# Patient Record
Sex: Male | Born: 1965 | Race: Black or African American | Hispanic: No | Smoking: Current some day smoker
Health system: Southern US, Community
[De-identification: ages and names within clinical notes are randomized; demographics above are authoritative.]

## PROBLEM LIST (undated history)

## (undated) DIAGNOSIS — J309 Allergic rhinitis, unspecified: Secondary | ICD-10-CM

## (undated) DIAGNOSIS — H109 Unspecified conjunctivitis: Secondary | ICD-10-CM

## (undated) HISTORY — DX: Allergic rhinitis, unspecified: J30.9

## (undated) HISTORY — DX: Unspecified conjunctivitis: H10.9

## (undated) HISTORY — PX: SHOULDER FUSION: SUR625

---

## 1999-06-14 ENCOUNTER — Emergency Department (HOSPITAL_COMMUNITY): Admission: EM | Admit: 1999-06-14 | Discharge: 1999-06-14 | Payer: Self-pay | Admitting: *Deleted

## 2004-01-18 ENCOUNTER — Emergency Department (HOSPITAL_COMMUNITY): Admission: AD | Admit: 2004-01-18 | Discharge: 2004-01-19 | Payer: Self-pay | Admitting: Emergency Medicine

## 2004-03-28 ENCOUNTER — Emergency Department (HOSPITAL_COMMUNITY): Admission: EM | Admit: 2004-03-28 | Discharge: 2004-03-29 | Payer: Self-pay | Admitting: *Deleted

## 2006-12-03 ENCOUNTER — Emergency Department (HOSPITAL_COMMUNITY): Admission: EM | Admit: 2006-12-03 | Discharge: 2006-12-03 | Payer: Self-pay | Admitting: *Deleted

## 2007-05-15 ENCOUNTER — Ambulatory Visit: Payer: Self-pay | Admitting: Internal Medicine

## 2007-06-26 ENCOUNTER — Ambulatory Visit: Payer: Self-pay | Admitting: Internal Medicine

## 2007-07-11 ENCOUNTER — Ambulatory Visit: Payer: Self-pay | Admitting: Internal Medicine

## 2007-07-14 ENCOUNTER — Ambulatory Visit: Payer: Self-pay | Admitting: Internal Medicine

## 2007-07-22 DIAGNOSIS — J309 Allergic rhinitis, unspecified: Secondary | ICD-10-CM | POA: Insufficient documentation

## 2007-07-24 ENCOUNTER — Ambulatory Visit: Payer: Self-pay | Admitting: Internal Medicine

## 2007-07-28 ENCOUNTER — Ambulatory Visit: Payer: Self-pay | Admitting: Internal Medicine

## 2007-08-01 ENCOUNTER — Ambulatory Visit: Payer: Self-pay | Admitting: Internal Medicine

## 2007-08-04 ENCOUNTER — Ambulatory Visit: Payer: Self-pay | Admitting: Internal Medicine

## 2007-08-08 ENCOUNTER — Ambulatory Visit: Payer: Self-pay | Admitting: Internal Medicine

## 2007-08-11 ENCOUNTER — Ambulatory Visit: Payer: Self-pay | Admitting: Internal Medicine

## 2007-08-15 ENCOUNTER — Ambulatory Visit: Payer: Self-pay | Admitting: Internal Medicine

## 2007-08-18 ENCOUNTER — Ambulatory Visit: Payer: Self-pay | Admitting: Internal Medicine

## 2007-08-29 ENCOUNTER — Ambulatory Visit: Payer: Self-pay | Admitting: Internal Medicine

## 2007-09-04 ENCOUNTER — Ambulatory Visit: Payer: Self-pay | Admitting: Internal Medicine

## 2007-09-08 ENCOUNTER — Ambulatory Visit: Payer: Self-pay | Admitting: Internal Medicine

## 2007-09-12 ENCOUNTER — Ambulatory Visit: Payer: Self-pay | Admitting: Internal Medicine

## 2007-09-13 ENCOUNTER — Ambulatory Visit: Payer: Self-pay | Admitting: Internal Medicine

## 2007-09-15 ENCOUNTER — Ambulatory Visit: Payer: Self-pay | Admitting: Internal Medicine

## 2007-09-26 ENCOUNTER — Ambulatory Visit: Payer: Self-pay | Admitting: Internal Medicine

## 2007-10-02 ENCOUNTER — Ambulatory Visit: Payer: Self-pay | Admitting: Internal Medicine

## 2007-10-05 HISTORY — PX: NOSE SURGERY: SHX723

## 2007-10-06 ENCOUNTER — Ambulatory Visit: Payer: Self-pay | Admitting: Internal Medicine

## 2007-10-10 ENCOUNTER — Ambulatory Visit: Payer: Self-pay | Admitting: Internal Medicine

## 2007-10-13 ENCOUNTER — Ambulatory Visit: Payer: Self-pay | Admitting: Internal Medicine

## 2007-10-23 ENCOUNTER — Encounter: Payer: Self-pay | Admitting: Internal Medicine

## 2007-10-31 ENCOUNTER — Ambulatory Visit: Payer: Self-pay | Admitting: Internal Medicine

## 2007-11-15 ENCOUNTER — Ambulatory Visit: Payer: Self-pay | Admitting: Internal Medicine

## 2007-11-17 ENCOUNTER — Ambulatory Visit: Payer: Self-pay | Admitting: Internal Medicine

## 2007-11-22 ENCOUNTER — Ambulatory Visit: Payer: Self-pay | Admitting: Internal Medicine

## 2007-11-24 ENCOUNTER — Ambulatory Visit: Payer: Self-pay | Admitting: Internal Medicine

## 2007-11-29 ENCOUNTER — Ambulatory Visit: Payer: Self-pay | Admitting: Internal Medicine

## 2007-12-01 ENCOUNTER — Ambulatory Visit: Payer: Self-pay | Admitting: Internal Medicine

## 2007-12-06 ENCOUNTER — Ambulatory Visit: Payer: Self-pay | Admitting: Internal Medicine

## 2007-12-08 ENCOUNTER — Ambulatory Visit: Payer: Self-pay | Admitting: Internal Medicine

## 2007-12-11 ENCOUNTER — Ambulatory Visit: Payer: Self-pay | Admitting: Internal Medicine

## 2007-12-12 ENCOUNTER — Ambulatory Visit: Payer: Self-pay | Admitting: Internal Medicine

## 2007-12-15 ENCOUNTER — Ambulatory Visit: Payer: Self-pay | Admitting: Internal Medicine

## 2007-12-20 ENCOUNTER — Ambulatory Visit: Payer: Self-pay | Admitting: Internal Medicine

## 2007-12-22 ENCOUNTER — Ambulatory Visit: Payer: Self-pay | Admitting: Internal Medicine

## 2007-12-26 ENCOUNTER — Ambulatory Visit: Payer: Self-pay | Admitting: Internal Medicine

## 2007-12-29 ENCOUNTER — Ambulatory Visit: Payer: Self-pay | Admitting: Internal Medicine

## 2008-01-03 ENCOUNTER — Ambulatory Visit: Payer: Self-pay | Admitting: Internal Medicine

## 2008-01-10 ENCOUNTER — Ambulatory Visit: Payer: Self-pay | Admitting: Internal Medicine

## 2008-01-18 ENCOUNTER — Ambulatory Visit: Payer: Self-pay | Admitting: Internal Medicine

## 2008-01-25 ENCOUNTER — Ambulatory Visit: Payer: Self-pay | Admitting: Internal Medicine

## 2008-02-01 ENCOUNTER — Ambulatory Visit: Payer: Self-pay | Admitting: Internal Medicine

## 2008-02-08 ENCOUNTER — Ambulatory Visit: Payer: Self-pay | Admitting: Internal Medicine

## 2008-02-22 ENCOUNTER — Ambulatory Visit: Payer: Self-pay | Admitting: Internal Medicine

## 2008-02-29 ENCOUNTER — Ambulatory Visit: Payer: Self-pay | Admitting: Internal Medicine

## 2008-03-15 ENCOUNTER — Ambulatory Visit: Payer: Self-pay | Admitting: Internal Medicine

## 2008-04-04 ENCOUNTER — Ambulatory Visit: Payer: Self-pay | Admitting: Internal Medicine

## 2008-04-11 ENCOUNTER — Ambulatory Visit: Payer: Self-pay | Admitting: Internal Medicine

## 2008-04-18 ENCOUNTER — Ambulatory Visit: Payer: Self-pay | Admitting: Internal Medicine

## 2008-04-26 ENCOUNTER — Ambulatory Visit: Payer: Self-pay | Admitting: Internal Medicine

## 2008-04-29 ENCOUNTER — Ambulatory Visit: Payer: Self-pay | Admitting: Internal Medicine

## 2008-05-03 ENCOUNTER — Ambulatory Visit: Payer: Self-pay | Admitting: Internal Medicine

## 2008-05-13 ENCOUNTER — Ambulatory Visit: Payer: Self-pay | Admitting: Internal Medicine

## 2008-05-24 ENCOUNTER — Ambulatory Visit: Payer: Self-pay | Admitting: Internal Medicine

## 2008-05-30 ENCOUNTER — Ambulatory Visit: Payer: Self-pay | Admitting: Internal Medicine

## 2008-06-14 ENCOUNTER — Ambulatory Visit: Payer: Self-pay | Admitting: Internal Medicine

## 2008-06-21 ENCOUNTER — Ambulatory Visit: Payer: Self-pay | Admitting: Internal Medicine

## 2008-07-03 ENCOUNTER — Ambulatory Visit: Payer: Self-pay | Admitting: Internal Medicine

## 2008-07-09 ENCOUNTER — Ambulatory Visit: Payer: Self-pay | Admitting: Internal Medicine

## 2008-07-26 ENCOUNTER — Ambulatory Visit: Payer: Self-pay | Admitting: Internal Medicine

## 2008-08-02 ENCOUNTER — Ambulatory Visit: Payer: Self-pay | Admitting: Internal Medicine

## 2008-08-09 ENCOUNTER — Ambulatory Visit: Payer: Self-pay | Admitting: Internal Medicine

## 2008-08-15 ENCOUNTER — Ambulatory Visit: Payer: Self-pay | Admitting: Internal Medicine

## 2008-08-27 ENCOUNTER — Ambulatory Visit: Payer: Self-pay | Admitting: Internal Medicine

## 2008-09-06 ENCOUNTER — Ambulatory Visit: Payer: Self-pay | Admitting: Internal Medicine

## 2008-09-17 ENCOUNTER — Ambulatory Visit: Payer: Self-pay | Admitting: Internal Medicine

## 2008-09-18 ENCOUNTER — Ambulatory Visit: Payer: Self-pay | Admitting: Internal Medicine

## 2008-10-08 ENCOUNTER — Ambulatory Visit: Payer: Self-pay | Admitting: Internal Medicine

## 2008-10-18 ENCOUNTER — Ambulatory Visit: Payer: Self-pay | Admitting: Internal Medicine

## 2008-10-25 ENCOUNTER — Ambulatory Visit: Payer: Self-pay | Admitting: Internal Medicine

## 2008-11-01 ENCOUNTER — Ambulatory Visit: Payer: Self-pay | Admitting: Internal Medicine

## 2008-11-11 ENCOUNTER — Ambulatory Visit: Payer: Self-pay | Admitting: Internal Medicine

## 2008-11-29 ENCOUNTER — Ambulatory Visit: Payer: Self-pay | Admitting: Internal Medicine

## 2008-12-11 ENCOUNTER — Ambulatory Visit: Payer: Self-pay | Admitting: Internal Medicine

## 2008-12-20 ENCOUNTER — Ambulatory Visit: Payer: Self-pay | Admitting: Internal Medicine

## 2008-12-27 ENCOUNTER — Ambulatory Visit: Payer: Self-pay | Admitting: Internal Medicine

## 2009-01-10 ENCOUNTER — Ambulatory Visit: Payer: Self-pay | Admitting: Internal Medicine

## 2009-01-29 ENCOUNTER — Ambulatory Visit: Payer: Self-pay | Admitting: Internal Medicine

## 2009-02-21 ENCOUNTER — Ambulatory Visit: Payer: Self-pay | Admitting: Internal Medicine

## 2009-02-26 ENCOUNTER — Ambulatory Visit: Payer: Self-pay | Admitting: Internal Medicine

## 2009-03-13 ENCOUNTER — Ambulatory Visit: Payer: Self-pay | Admitting: Internal Medicine

## 2009-03-20 ENCOUNTER — Ambulatory Visit: Payer: Self-pay | Admitting: Internal Medicine

## 2009-03-28 ENCOUNTER — Ambulatory Visit: Payer: Self-pay | Admitting: Internal Medicine

## 2009-04-01 ENCOUNTER — Ambulatory Visit: Payer: Self-pay | Admitting: Internal Medicine

## 2009-04-10 ENCOUNTER — Ambulatory Visit: Payer: Self-pay | Admitting: Internal Medicine

## 2009-04-18 ENCOUNTER — Ambulatory Visit: Payer: Self-pay | Admitting: Internal Medicine

## 2009-04-25 ENCOUNTER — Ambulatory Visit: Payer: Self-pay | Admitting: Internal Medicine

## 2009-05-01 ENCOUNTER — Ambulatory Visit: Payer: Self-pay | Admitting: Internal Medicine

## 2009-05-21 ENCOUNTER — Ambulatory Visit: Payer: Self-pay | Admitting: Internal Medicine

## 2009-05-30 ENCOUNTER — Ambulatory Visit: Payer: Self-pay | Admitting: Internal Medicine

## 2009-06-05 ENCOUNTER — Telehealth (INDEPENDENT_AMBULATORY_CARE_PROVIDER_SITE_OTHER): Payer: Self-pay | Admitting: *Deleted

## 2009-06-06 ENCOUNTER — Ambulatory Visit: Payer: Self-pay | Admitting: Internal Medicine

## 2009-06-12 ENCOUNTER — Ambulatory Visit: Payer: Self-pay | Admitting: Internal Medicine

## 2009-06-26 ENCOUNTER — Ambulatory Visit: Payer: Self-pay | Admitting: Internal Medicine

## 2009-07-03 ENCOUNTER — Ambulatory Visit: Payer: Self-pay | Admitting: Internal Medicine

## 2009-07-07 ENCOUNTER — Ambulatory Visit: Payer: Self-pay | Admitting: Internal Medicine

## 2009-07-21 ENCOUNTER — Ambulatory Visit: Payer: Self-pay | Admitting: Internal Medicine

## 2009-08-01 ENCOUNTER — Ambulatory Visit: Payer: Self-pay | Admitting: Internal Medicine

## 2009-08-22 ENCOUNTER — Ambulatory Visit: Payer: Self-pay | Admitting: Internal Medicine

## 2009-08-27 ENCOUNTER — Ambulatory Visit: Payer: Self-pay | Admitting: Internal Medicine

## 2009-09-19 ENCOUNTER — Ambulatory Visit: Payer: Self-pay | Admitting: Internal Medicine

## 2009-10-06 ENCOUNTER — Ambulatory Visit: Payer: Self-pay | Admitting: Internal Medicine

## 2009-10-08 ENCOUNTER — Ambulatory Visit: Payer: Self-pay | Admitting: Internal Medicine

## 2009-10-24 ENCOUNTER — Ambulatory Visit: Payer: Self-pay | Admitting: Internal Medicine

## 2009-11-07 ENCOUNTER — Ambulatory Visit: Payer: Self-pay | Admitting: Internal Medicine

## 2009-12-01 ENCOUNTER — Ambulatory Visit: Payer: Self-pay | Admitting: Internal Medicine

## 2009-12-08 ENCOUNTER — Ambulatory Visit: Payer: Self-pay | Admitting: Internal Medicine

## 2009-12-16 ENCOUNTER — Ambulatory Visit: Payer: Self-pay | Admitting: Internal Medicine

## 2010-01-09 ENCOUNTER — Ambulatory Visit: Payer: Self-pay | Admitting: Internal Medicine

## 2010-01-26 ENCOUNTER — Ambulatory Visit: Payer: Self-pay | Admitting: Internal Medicine

## 2010-02-10 ENCOUNTER — Ambulatory Visit: Payer: Self-pay | Admitting: Internal Medicine

## 2010-02-17 ENCOUNTER — Ambulatory Visit: Payer: Self-pay | Admitting: Internal Medicine

## 2010-02-24 ENCOUNTER — Ambulatory Visit: Payer: Self-pay | Admitting: Internal Medicine

## 2010-03-06 ENCOUNTER — Ambulatory Visit: Payer: Self-pay | Admitting: Internal Medicine

## 2010-03-13 ENCOUNTER — Ambulatory Visit: Payer: Self-pay | Admitting: Internal Medicine

## 2010-03-19 ENCOUNTER — Ambulatory Visit: Payer: Self-pay | Admitting: Internal Medicine

## 2010-03-31 ENCOUNTER — Ambulatory Visit: Payer: Self-pay | Admitting: Internal Medicine

## 2010-04-21 ENCOUNTER — Ambulatory Visit: Payer: Self-pay | Admitting: Internal Medicine

## 2010-05-11 ENCOUNTER — Ambulatory Visit: Payer: Self-pay | Admitting: Internal Medicine

## 2010-05-21 ENCOUNTER — Ambulatory Visit: Payer: Self-pay | Admitting: Internal Medicine

## 2010-06-04 ENCOUNTER — Ambulatory Visit: Payer: Self-pay | Admitting: Internal Medicine

## 2010-06-05 ENCOUNTER — Ambulatory Visit: Payer: Self-pay | Admitting: Internal Medicine

## 2010-06-11 ENCOUNTER — Ambulatory Visit: Payer: Self-pay | Admitting: Internal Medicine

## 2010-06-16 ENCOUNTER — Ambulatory Visit: Payer: Self-pay | Admitting: Internal Medicine

## 2010-06-23 ENCOUNTER — Ambulatory Visit: Payer: Self-pay | Admitting: Internal Medicine

## 2010-07-03 ENCOUNTER — Ambulatory Visit: Payer: Self-pay | Admitting: Internal Medicine

## 2010-07-10 ENCOUNTER — Ambulatory Visit: Payer: Self-pay | Admitting: Internal Medicine

## 2010-07-13 ENCOUNTER — Ambulatory Visit: Payer: Self-pay | Admitting: Internal Medicine

## 2010-07-20 ENCOUNTER — Ambulatory Visit: Payer: Self-pay | Admitting: Internal Medicine

## 2010-07-28 ENCOUNTER — Ambulatory Visit: Payer: Self-pay | Admitting: Internal Medicine

## 2010-08-04 ENCOUNTER — Ambulatory Visit: Payer: Self-pay | Admitting: Internal Medicine

## 2010-08-14 ENCOUNTER — Ambulatory Visit: Payer: Self-pay | Admitting: Internal Medicine

## 2010-08-19 ENCOUNTER — Ambulatory Visit: Payer: Self-pay | Admitting: Internal Medicine

## 2010-08-28 ENCOUNTER — Ambulatory Visit: Payer: Self-pay | Admitting: Internal Medicine

## 2010-09-10 ENCOUNTER — Ambulatory Visit: Payer: Self-pay | Admitting: Internal Medicine

## 2010-09-22 ENCOUNTER — Ambulatory Visit: Payer: Self-pay | Admitting: Internal Medicine

## 2010-10-02 ENCOUNTER — Ambulatory Visit: Payer: Self-pay | Admitting: Internal Medicine

## 2010-10-17 ENCOUNTER — Ambulatory Visit: Payer: Self-pay | Admitting: Internal Medicine

## 2010-10-22 ENCOUNTER — Ambulatory Visit: Payer: Self-pay | Admitting: Internal Medicine

## 2010-10-23 ENCOUNTER — Ambulatory Visit: Payer: Self-pay | Admitting: Internal Medicine

## 2010-11-02 ENCOUNTER — Ambulatory Visit: Payer: Self-pay | Admitting: Internal Medicine

## 2010-11-05 NOTE — Miscellaneous (Signed)
Summary: Injection Record / [facility]    Injection Record / [facility]    Imported By: Lennie Odor 06/05/2010 10:05:39  _____________________________________________________________________  External Attachment:    Type:   Image     Comment:   External Document

## 2010-11-05 NOTE — Miscellaneous (Signed)
Summary: Injection Record/[facility]  Injection Record/[facility]   Imported By: Sherian Rein 02/24/2010 11:58:41  _____________________________________________________________________  External Attachment:    Type:   Image     Comment:   External Document

## 2010-11-05 NOTE — Assessment & Plan Note (Signed)
Summary: allergy problem/  mbw   CC:  Allergy troubles-nasal congestion and watery eyes.Shane Pugh  History of Present Illness: From 07/24/07:  HISTORY:  He is getting vaccine here and recognizing that if he misses because of his job schedule he does not feel as well.  He has had no problem with reactions to his shots.  07/09/08- Worst in the Spring when eyes get bad. Saw NP then. Main c/o today is nasal congestion, perrennial and nonseasonal, with little discharge, no HA or earache. Chest ok.Nasal sprays never hslp for more than a few hours. Always congested nose.A little discharge.  No headache. . Works night shift.  Zyrtec only helps a few hours at a time.  December 16, 2009- Allergic rhinitis Was getting occasional steroid injection at [facility] but they cautioned him it might damage his hip.  Continues allergy vaccine at 1:10. Needing Allegra 180. Starts usually mid February. Usually problems mainly with Spring time. Has used steroid nasal sprays. Tried allegra.   Current Medications (verified): 1)  Allergy Vaccine 1:10 Gh .... Build From 1:50  Allergies (verified): No Known Drug Allergies  Past History:  Past Medical History: Last updated: 12/12/2007  ALLERGIC RHINITIS WITH CONJUNCTIVITIS (ICD-477.9)    Family History: Last updated: 08/01/2008 Mother-deceased age27 Father- living age 66 Sibling 1-living age43 Sibling 2-deceased age 63; vehicle accident  Social History: Last updated: 08/01/2008 Patient never smoked.  Positive history of passive tobacco smoke exposure.  ETOH-twice monthly Divorced with 2 children  Risk Factors: Smoking Status: never (08-01-08) Passive Smoke Exposure: yes (01-Aug-2008)  Review of Systems      See HPI  The patient denies anorexia, fever, weight loss, weight gain, vision loss, decreased hearing, hoarseness, chest pain, syncope, dyspnea on exertion, peripheral edema, prolonged cough, headaches, hemoptysis, and severe indigestion/heartburn.     Vital Signs:  Patient profile:   45 year old male Weight:      207.38 pounds O2 Sat:      99 % on Room air Pulse rate:   90 / minute BP sitting:   126 / 82  (left arm) Cuff size:   regular  Vitals Entered By: Reynaldo Minium CMA (December 16, 2009 10:14 AM)  O2 Flow:  Room air  Physical Exam  Additional Exam:  General: A/Ox3; pleasant and cooperative, NAD, SKIN: no rash, lesions NODES: no lymphadenopathy HEENT: [place]/AT, EOM- WNL, Conjuctivae- clear, PERRLA, TM-WNL, Nose- external dev to right, with septal deviation. Mucosa not markedly congested. Secretions clear. , Throat- clear and wnl NECK: Supple w/ fair ROM, JVD- none, normal carotid impulses w/o bruits Thyroid- normal to palpation CHEST: Clear to P&A HEART: RRR, no m/g/r heard ABDOMEN: Soft and nl;  XBJ:YNWG, nl pulses, no edema  NEURO: Grossly intact to observation      Impression & Recommendations:  Problem # 1:  ALLERGIC RHINITIS WITH CONJUNCTIVITIS (ICD-477.9)  Mild seasonal rhinitis exacerbatiojn. He continues allergy vaccine. We discussed nasal strips. Will sugggest adding a decongestant and trying a nasal steroid spray.  His updated medication list for this problem includes:    Nasonex 50 Mcg/act Susp (Mometasone furoate) .Shane Pugh... 1-2 sprays each nostril once every day  Orders: Est. Patient Level II (95621)  Medications Added to Medication List This Visit: 1)  Allegra-d 24 Hour 180-240 Mg Xr24h-tab (Fexofenadine-pseudoephedrine) 2)  Nasonex 50 Mcg/act Susp (Mometasone furoate) .Shane Pugh.. 1-2 sprays each nostril once every day  Patient Instructions: 1)  Please schedule a follow-up appointment in 1 year. 2)  continue allergy vaccine 3)  Try Allegra/ fexofenadine -  D 24 hr otc at pharmacy counter 4)  sample and script Nasonex nasal spray: 1-2 puffs each nostril every night at bedtime 5)  nasal strips- reminder to try Prescriptions: NASONEX 50 MCG/ACT SUSP (MOMETASONE FUROATE) 1-2 sprays each nostril once every day   #1 x prn   Entered and Authorized by:   Waymon Budge MD   Signed by:   Waymon Budge MD on 12/16/2009   Method used:   Print then Give to Patient   RxID:   [id number]

## 2010-11-05 NOTE — Miscellaneous (Signed)
Summary: Injection [employer] [facility] [employer]   Imported By: Sherian Rein 08/26/2010 14:14:49  _____________________________________________________________________  External Attachment:    Type:   Image     Comment:   External Document

## 2010-11-06 NOTE — Miscellaneous (Signed)
Summary: Injection Record/[facility]  Injection Record/[facility]   Imported By: Sherian Rein 03/26/2010 08:23:47  _____________________________________________________________________  External Attachment:    Type:   Image     Comment:   External Document

## 2010-11-06 NOTE — Miscellaneous (Signed)
Summary: Injection Orders / [facility]    Injection Orders / [facility]    Imported By: Lennie Odor 03/03/2010 15:14:55  _____________________________________________________________________  External Attachment:    Type:   Image     Comment:   External Document

## 2010-11-09 ENCOUNTER — Encounter: Payer: Self-pay | Admitting: Internal Medicine

## 2010-11-09 ENCOUNTER — Ambulatory Visit (INDEPENDENT_AMBULATORY_CARE_PROVIDER_SITE_OTHER): Payer: Self-pay | Admitting: Internal Medicine

## 2010-11-09 DIAGNOSIS — J309 Allergic rhinitis, unspecified: Secondary | ICD-10-CM

## 2010-11-19 NOTE — Assessment & Plan Note (Signed)
Summary: ROV//SH   CC:  Follow up visit-allergies; having sneezing during this time and cant tell a difference with allergy injecitons at this time.Marland Kitchen  History of Present Illness:  07/09/08- Worst in the Spring when eyes get bad. Saw NP then. Main c/o today is nasal congestion, perrennial and nonseasonal, with little discharge, no HA or earache. Chest ok.Nasal sprays never hslp for more than a few hours. Always congested nose.A little discharge.  No headache. . Works night shift.  Zyrtec only helps a few hours at a time.  December 16, 2009- Allergic rhinitis Was getting occasional steroid injection at [facility] but they cautioned him it might damage his hip.  Continues allergy vaccine at 1:10. Needing Allegra 180. Starts usually mid February. Usually problems mainly with Spring time. Has used steroid nasal sprays. Tried allegra.  2010/11/11- Allergic rhinitis Nurse-CC: Follow up visit-allergies; having sneezing during this time and cant tell a difference with allergy injecitons at this time. Every year around this time he starts sneezing and itching.  He continues Nasonex each night. Considering Zyrtec, but says it doesn't help much. Denies wheeze, cough, chest tightness, purulent mucus, headache, fever, nodes.       Preventive Screening-Counseling & Management  Alcohol-Tobacco     Smoking Status: never     Year Started: occasional     Passive Smoke Exposure: yes  Current Medications (verified): 1)  Allergy Vaccine 1:10 Gh .... Build From 1:50  Allergies (verified): No Known Drug Allergies  Past History:  Past Medical History: Last updated: 12/12/2007  ALLERGIC RHINITIS WITH CONJUNCTIVITIS (ICD-477.9)    Family History: Last updated: Nov 11, 2010 Mother-deceased age27 Father- living age 28 Sibling 1-living age43 Sibling 2-deceased age 65; vehicle accident Children- allergic rhinits  Social History: Last updated: 07/19/2008 Patient never smoked.  Positive history of  passive tobacco smoke exposure.  ETOH-twice monthly Divorced with 2 children  Risk Factors: Smoking Status: never (11-11-2010) Passive Smoke Exposure: yes (11/11/10)  Past Surgical History: none reported  Family History: Mother-deceased age27 Father- living age 60 Sibling 1-living age53 Sibling 2-deceased age 18; vehicle accident Children- allergic rhinits  Review of Systems      See HPI       The patient complains of nasal congestion/difficulty breathing through nose, sneezing, and itching.  The patient denies shortness of breath with activity, shortness of breath at rest, productive cough, non-productive cough, coughing up blood, chest pain, irregular heartbeats, acid heartburn, indigestion, loss of appetite, weight change, abdominal pain, difficulty swallowing, sore throat, tooth/dental problems, headaches, ear ache, depression, hand/feet swelling, rash, change in color of mucus, and fever.    Vital Signs:  Patient profile:   45 year old male Weight:      212.13 pounds O2 Sat:      98 % on Room air Pulse rate:   61 / minute BP sitting:   124 / 70  (left arm) Cuff size:   large  Vitals Entered By: Reynaldo Minium CMA 11/11/10 10:27 AM)  O2 Flow:  Room air CC: Follow up visit-allergies; having sneezing during this time and cant tell a difference with allergy injecitons at this time.   Physical Exam  Additional Exam:  General: A/Ox3; pleasant and cooperative, NAD, SKIN: no rash, lesions NODES: no lymphadenopathy HEENT: [place]/AT, EOM- WNL, Conjuctivae- clear, PERRLA, TM-WNL, Nose- external dev to right, with septal deviation. Mucosa red, edmeatous, no polyps. Secretions clear. , Throat- clear and wnl NECK: Supple w/ fair ROM, JVD- none, normal carotid impulses  w/o bruits Thyroid- normal to palpation CHEST: Clear to P&A HEART: RRR, no m/g/r heard ABDOMEN: Soft and nl;  OZH:YQMV, nl pulses, no edema  NEURO: Grossly intact to observation      Impression &  Recommendations:  Problem # 1:  ALLERGIC RHINITIS WITH CONJUNCTIVITIS (ICD-477.9)  He continues to have a very early seasonal pattern- due to indoor heat or early tree pollen rhintis. We are hoping to minimize steroids and I will let him stop shots. . For now we will add an antihistamine nasal spray.   The following medications were removed from the medication list:    Nasonex 50 Mcg/act Susp (Mometasone furoate) .Marland Kitchen... 1-2 sprays each nostril once every day  Other Orders: Est. Patient Level III (78469)  Patient Instructions: 1)  Please schedule a follow-up appointment in 4 months. 2)  OK to stop allergy shots now. Please let the allergy lab know if you decide to stop. 3)  Try sample/ script Astepro nasal antihistamine spray 4)     1-2 sprays each nostril up to twice daily if needed 5)  Try otc antihistamine fexofenadine 180/ Allegra 180 6)  If congested, you can add Phenylephrine (Sudafed-PE and others) as a decongestant.

## 2010-12-16 ENCOUNTER — Ambulatory Visit: Payer: Self-pay | Admitting: Internal Medicine

## 2011-01-23 ENCOUNTER — Ambulatory Visit (INDEPENDENT_AMBULATORY_CARE_PROVIDER_SITE_OTHER): Payer: Self-pay

## 2011-01-23 ENCOUNTER — Inpatient Hospital Stay (INDEPENDENT_AMBULATORY_CARE_PROVIDER_SITE_OTHER)
Admission: RE | Admit: 2011-01-23 | Discharge: 2011-01-23 | Disposition: A | Discharge status: Home / Self Care | Payer: Self-pay | Source: Ambulatory Visit | Attending: Family Medicine | Admitting: Family Medicine

## 2011-01-23 DIAGNOSIS — M25519 Pain in unspecified shoulder: Secondary | ICD-10-CM

## 2011-01-23 DIAGNOSIS — M719 Bursopathy, unspecified: Secondary | ICD-10-CM

## 2011-01-23 DIAGNOSIS — M67919 Unspecified disorder of synovium and tendon, unspecified shoulder: Secondary | ICD-10-CM

## 2011-02-16 NOTE — Assessment & Plan Note (Signed)
[facility]                             PULMONARY OFFICE NOTE   Shane Pugh, Shane Pugh                      MRN:          [id number]  DATE:07/24/2007                            DOB:          [birth year]    PROBLEM LIST:  1. Allergic rhinitis.  2. Allergic conjunctivitis.   HISTORY:  He is getting vaccine here and recognizing that if he misses  because of his job schedule he does not feel as well.  He has had no  problem with reactions to his shots.  Today he feels well.  We need to  clarify the status of his transfer of care from his allergist in [facility].  We have his vaccine, but no records have been brought over to  his chart, so I will check on that.  He is on no medication with no  medication allergy.   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  He looks comfortable.  Conjunctivae are not injected.  There is external deviation of the nose  with no history of trauma.  Nasal mucosa looks clear.  LUNGS:  Clear.  Pulse regular.   IMPRESSION:  1. Allergic rhinitis.  2. Allergic conjunctivitis.   PLAN:  He will continue vaccine buildup, and I will discuss status with  the allergy lab.  Try p.r.n. Claritin.  Schedule return 4 months,  earlier p.r.n.     Clinton D. Maple Hudson, MD, Tonny Bollman, FACP  Electronically Signed    CDY/MedQ  DD: 07/24/2007  DT: 07/25/2007  Job #: [id number]

## 2011-02-16 NOTE — Assessment & Plan Note (Signed)
[facility]                             PULMONARY OFFICE NOTE   DELOY, ARCHEY                      MRN:          [id number]  DATE:05/15/2007                            DOB:          11-27-1965    PROBLEM:  A 45 year old man, self-referred, allergic eyes and nose.   HISTORY:  This man began noticing itching and watering of eyes and nose  about three years ago at a time of heavy outdoor pollen exposure.  Each  year since then he has been having significant recurrence, usually in  March.  He was using a lot of antihistamine meds, then got allergy  tested and was begun on allergy vaccine in [place].  He does not  know the name of the physician, but is having to drive there twice a  week to get his shots and wishes to transfer care here to save that  travel difficulty.  He has had no trouble with his allergy vaccine.  No  asthma and no recognized prior allergy complaints.  He denies unusual  reactions to insects, foods, latex, contrast dye, or aspirin.   MEDICATIONS:  Limited to allergy vaccine.   ALLERGIES:  No medication allergy.   REVIEW OF SYSTEMS:  Limited to nasal congestion.  He denies other  symptoms currently but has watering and itching of eyes also in the  spring with no chest tightness, wheeze, or cough.   PAST MEDICAL HISTORY:  Good general health.  No ENT surgery.  No asthma.  No significant medical problems.   SOCIAL HISTORY:  Never smoked.  Separated with children, lives alone.  Works as a Emergency planning/management officer.   FAMILY HISTORY:  Children have some allergic rhinitis complaints.  He is  not aware of similar problems in his own parents or siblings.   OBJECTIVE:  Weight 219 pounds with his full equipment including vest and  utility belt.  Blood pressure 118/86, pulse 63, room air saturation 99%.  Comfortable-appearing, fit-appearing man.  SKIN:  No obvious rash.  ADENOPATHY:  None found at the neck or supraclavicular areas.  HEENT:  Conjunctivae are not injected.  Turbinates are edematous.  Palates facing 3-4/4.  HEART:  Through his vest I do not hear murmur or gallop.  Heart sounds  are regular.  LUNGS:  Lung sounds are not heard well but sound clear.  EXTREMITIES:  Without cyanosis, clubbing, or edema.   IMPRESSION:  Seasonal allergic rhinitis and allergic conjunctivitis.   PLAN:  He will get records and vaccine transferred here.  We have  discussed risk and safety issues of allergy vaccine and compared  expectations with antihistamines.  We have discussed  environmental precautions, although his work requires that he be  outdoors and exposed to the prevailing pollens.  I do not get much  history that there are problems with his home environment or workplace.  Schedule return with me in two months, earlier p.r.n.     Clinton D. Maple Hudson, MD, Tonny Bollman, FACP  Electronically Signed    CDY/MedQ  DD: 05/15/2007  DT: 05/17/2007  Job #: [id number]

## 2011-02-25 ENCOUNTER — Encounter: Payer: Self-pay | Admitting: Internal Medicine

## 2011-03-10 ENCOUNTER — Ambulatory Visit: Payer: Self-pay | Admitting: Internal Medicine

## 2015-06-14 ENCOUNTER — Inpatient Hospital Stay (HOSPITAL_COMMUNITY)
Admission: EM | Admit: 2015-06-14 | Discharge: 2015-06-18 | DRG: 184 | Disposition: A | Discharge status: Home / Self Care | Payer: Commercial Managed Care - HMO | Attending: General Surgery | Admitting: General Surgery

## 2015-06-14 ENCOUNTER — Emergency Department (HOSPITAL_COMMUNITY): Payer: Commercial Managed Care - HMO

## 2015-06-14 ENCOUNTER — Encounter (HOSPITAL_COMMUNITY): Payer: Self-pay

## 2015-06-14 DIAGNOSIS — S2243XA Multiple fractures of ribs, bilateral, initial encounter for closed fracture: Secondary | ICD-10-CM | POA: Diagnosis not present

## 2015-06-14 DIAGNOSIS — S2239XA Fracture of one rib, unspecified side, initial encounter for closed fracture: Secondary | ICD-10-CM

## 2015-06-14 DIAGNOSIS — S2232XA Fracture of one rib, left side, initial encounter for closed fracture: Secondary | ICD-10-CM

## 2015-06-14 DIAGNOSIS — S2249XA Multiple fractures of ribs, unspecified side, initial encounter for closed fracture: Secondary | ICD-10-CM

## 2015-06-14 DIAGNOSIS — R0781 Pleurodynia: Secondary | ICD-10-CM | POA: Diagnosis not present

## 2015-06-14 DIAGNOSIS — N189 Chronic kidney disease, unspecified: Secondary | ICD-10-CM | POA: Diagnosis present

## 2015-06-14 DIAGNOSIS — Y92410 Unspecified street and highway as the place of occurrence of the external cause: Secondary | ICD-10-CM

## 2015-06-14 DIAGNOSIS — S27329A Contusion of lung, unspecified, initial encounter: Secondary | ICD-10-CM | POA: Diagnosis present

## 2015-06-14 LAB — COMPREHENSIVE METABOLIC PANEL
ALK PHOS: 57 U/L (ref 38–126)
ALT: 45 U/L (ref 17–63)
ANION GAP: 10 (ref 5–15)
AST: 40 U/L (ref 15–41)
Albumin: 3.8 g/dL (ref 3.5–5.0)
BILIRUBIN TOTAL: 0.8 mg/dL (ref 0.3–1.2)
BUN: 15 mg/dL (ref 6–20)
CALCIUM: 8.7 mg/dL — AB (ref 8.9–10.3)
CO2: 25 mmol/L (ref 22–32)
Chloride: 105 mmol/L (ref 101–111)
Creatinine, Ser: 1.58 mg/dL — ABNORMAL HIGH (ref 0.61–1.24)
GFR calc non Af Amer: 50 mL/min — ABNORMAL LOW (ref >60)
GFR, EST AFRICAN AMERICAN: 58 mL/min — AB (ref >60)
Glucose, Bld: 137 mg/dL — ABNORMAL HIGH (ref 65–99)
POTASSIUM: 3 mmol/L — AB (ref 3.5–5.1)
SODIUM: 140 mmol/L (ref 135–145)
TOTAL PROTEIN: 7.3 g/dL (ref 6.5–8.1)

## 2015-06-14 LAB — CBC
HEMATOCRIT: 44.6 % (ref 39.0–52.0)
Hemoglobin: 15.2 g/dL (ref 13.0–17.0)
MCH: 27.8 pg (ref 26.0–34.0)
MCHC: 34.1 g/dL (ref 30.0–36.0)
MCV: 81.7 fL (ref 78.0–100.0)
PLATELETS: 237 10*3/uL (ref 150–400)
RBC: 5.46 MIL/uL (ref 4.22–5.81)
RDW: 14.2 % (ref 11.5–15.5)
WBC: 14.8 10*3/uL — ABNORMAL HIGH (ref 4.0–10.5)

## 2015-06-14 LAB — PROTIME-INR
INR: 1.14 (ref 0.00–1.49)
Prothrombin Time: 14.8 seconds (ref 11.6–15.2)

## 2015-06-14 LAB — SAMPLE TO BLOOD BANK

## 2015-06-14 LAB — I-STAT TROPONIN, ED: Troponin i, poc: 0 ng/mL (ref 0.00–0.08)

## 2015-06-14 MED ORDER — HYDROMORPHONE HCL 1 MG/ML IJ SOLN
1.0000 mg | Freq: Once | INTRAMUSCULAR | Status: AC
Start: 2015-06-14 — End: 2015-06-14
  Administered 2015-06-14: 1 mg via INTRAVENOUS
  Filled 2015-06-14: qty 1

## 2015-06-14 MED ORDER — ONDANSETRON HCL 4 MG/2ML IJ SOLN
4.0000 mg | Freq: Once | INTRAMUSCULAR | Status: AC
  Administered 2015-06-14: 4 mg via INTRAVENOUS
  Filled 2015-06-14: qty 2

## 2015-06-14 MED ORDER — HYDROMORPHONE HCL 1 MG/ML IJ SOLN
1.0000 mg | Freq: Once | INTRAMUSCULAR | Status: AC
  Administered 2015-06-14: 1 mg via INTRAVENOUS
  Filled 2015-06-14: qty 1

## 2015-06-14 MED ORDER — TETANUS-DIPHTH-ACELL PERTUSSIS 5-2.5-18.5 LF-MCG/0.5 IM SUSP
0.5000 mL | Freq: Once | INTRAMUSCULAR | Status: AC
  Administered 2015-06-14: 0.5 mL via INTRAMUSCULAR
  Filled 2015-06-14: qty 0.5

## 2015-06-14 MED ORDER — SODIUM CHLORIDE 0.9 % IV BOLUS (SEPSIS)
1000.0000 mL | Freq: Once | INTRAVENOUS | Status: AC
  Administered 2015-06-14: 1000 mL via INTRAVENOUS

## 2015-06-14 MED ORDER — IOHEXOL 300 MG/ML  SOLN
100.0000 mL | Freq: Once | INTRAMUSCULAR | Status: AC | PRN
  Administered 2015-06-14: 100 mL via INTRAVENOUS
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
## 2015-06-15 ENCOUNTER — Inpatient Hospital Stay (HOSPITAL_COMMUNITY): Payer: Commercial Managed Care - HMO

## 2015-06-15 DIAGNOSIS — S2243XA Multiple fractures of ribs, bilateral, initial encounter for closed fracture: Secondary | ICD-10-CM | POA: Diagnosis present

## 2015-06-15 DIAGNOSIS — N189 Chronic kidney disease, unspecified: Secondary | ICD-10-CM | POA: Diagnosis present

## 2015-06-15 DIAGNOSIS — Y92410 Unspecified street and highway as the place of occurrence of the external cause: Secondary | ICD-10-CM | POA: Diagnosis not present

## 2015-06-15 DIAGNOSIS — S27329A Contusion of lung, unspecified, initial encounter: Secondary | ICD-10-CM | POA: Diagnosis present

## 2015-06-15 DIAGNOSIS — R0781 Pleurodynia: Secondary | ICD-10-CM | POA: Diagnosis present

## 2015-06-15 LAB — BASIC METABOLIC PANEL
Anion gap: 7 (ref 5–15)
BUN: 11 mg/dL (ref 6–20)
CALCIUM: 8.7 mg/dL — AB (ref 8.9–10.3)
CO2: 25 mmol/L (ref 22–32)
CREATININE: 1.26 mg/dL — AB (ref 0.61–1.24)
Chloride: 107 mmol/L (ref 101–111)
GFR calc non Af Amer: >60 mL/min (ref >60)
Glucose, Bld: 132 mg/dL — ABNORMAL HIGH (ref 65–99)
Potassium: 4.5 mmol/L (ref 3.5–5.1)
SODIUM: 139 mmol/L (ref 135–145)

## 2015-06-15 LAB — CBC
HCT: 42.5 % (ref 39.0–52.0)
HEMOGLOBIN: 14.6 g/dL (ref 13.0–17.0)
MCH: 28 pg (ref 26.0–34.0)
MCHC: 34.4 g/dL (ref 30.0–36.0)
MCV: 81.6 fL (ref 78.0–100.0)
PLATELETS: 226 10*3/uL (ref 150–400)
RBC: 5.21 MIL/uL (ref 4.22–5.81)
RDW: 14.6 % (ref 11.5–15.5)
WBC: 13.8 10*3/uL — ABNORMAL HIGH (ref 4.0–10.5)

## 2015-06-15 MED ORDER — DIPHENHYDRAMINE HCL 50 MG/ML IJ SOLN: 12.5000 mg | Freq: Four times a day (QID) | INTRAMUSCULAR | Status: DC | PRN

## 2015-06-15 MED ORDER — DIPHENHYDRAMINE HCL 12.5 MG/5ML PO ELIX: 12.5000 mg | ORAL_SOLUTION | Freq: Four times a day (QID) | ORAL | Status: DC | PRN

## 2015-06-15 MED ORDER — MORPHINE SULFATE 1 MG/ML IV SOLN
INTRAVENOUS | Status: DC
  Administered 2015-06-15: 19:00:00 via INTRAVENOUS
  Administered 2015-06-15: 18 mg via INTRAVENOUS
  Administered 2015-06-15: 12:00:00 via INTRAVENOUS
  Administered 2015-06-16: 4.5 mL via INTRAVENOUS
  Administered 2015-06-16: 0 mL via INTRAVENOUS
  Filled 2015-06-15 (×2): qty 25

## 2015-06-15 MED ORDER — SODIUM CHLORIDE 0.9 % IV SOLN
INTRAVENOUS | Status: DC
  Administered 2015-06-15: 02:00:00 via INTRAVENOUS

## 2015-06-15 MED ORDER — ONDANSETRON HCL 4 MG/2ML IJ SOLN
4.0000 mg | Freq: Four times a day (QID) | INTRAMUSCULAR | Status: DC | PRN
  Administered 2015-06-15 – 2015-06-17 (×3): 4 mg via INTRAVENOUS
  Filled 2015-06-15 (×3): qty 2

## 2015-06-15 MED ORDER — ONDANSETRON HCL 4 MG PO TABS
4.0000 mg | ORAL_TABLET | Freq: Four times a day (QID) | ORAL | Status: DC | PRN
  Administered 2015-06-18: 4 mg via ORAL
  Filled 2015-06-15: qty 1

## 2015-06-15 MED ORDER — METOCLOPRAMIDE HCL 5 MG/ML IJ SOLN
10.0000 mg | Freq: Once | INTRAMUSCULAR | Status: AC
  Administered 2015-06-15: 10 mg via INTRAVENOUS
  Filled 2015-06-15: qty 2

## 2015-06-15 MED ORDER — NALOXONE HCL 0.4 MG/ML IJ SOLN: 0.4000 mg | INTRAMUSCULAR | Status: DC | PRN

## 2015-06-15 MED ORDER — SODIUM CHLORIDE 0.9 % IJ SOLN: 9.0000 mL | INTRAMUSCULAR | Status: DC | PRN

## 2015-06-15 MED ORDER — ACETAMINOPHEN 325 MG PO TABS: 650.0000 mg | ORAL_TABLET | ORAL | Status: DC | PRN

## 2015-06-15 MED ORDER — HYDROMORPHONE HCL 1 MG/ML IJ SOLN
1.0000 mg | INTRAMUSCULAR | Status: DC | PRN
  Administered 2015-06-15: 1 mg via INTRAVENOUS
  Filled 2015-06-15: qty 1

## 2015-06-15 MED ORDER — DOCUSATE SODIUM 100 MG PO CAPS
100.0000 mg | ORAL_CAPSULE | Freq: Two times a day (BID) | ORAL | Status: DC
  Administered 2015-06-15 – 2015-06-18 (×7): 100 mg via ORAL
  Filled 2015-06-15 (×6): qty 1

## 2015-06-15 MED ORDER — ONDANSETRON HCL 4 MG/2ML IJ SOLN: 4.0000 mg | Freq: Four times a day (QID) | INTRAMUSCULAR | Status: DC | PRN

## 2015-06-15 MED ORDER — ENOXAPARIN SODIUM 40 MG/0.4ML ~~LOC~~ SOLN
40.0000 mg | SUBCUTANEOUS | Status: DC
  Administered 2015-06-15 – 2015-06-17 (×3): 40 mg via SUBCUTANEOUS
  Filled 2015-06-15 (×3): qty 0.4

## 2015-06-15 MED ORDER — OXYCODONE HCL 5 MG PO TABS: 10.0000 mg | ORAL_TABLET | ORAL | Status: DC | PRN

## 2015-06-15 MED ORDER — OXYCODONE HCL 5 MG PO TABS: 5.0000 mg | ORAL_TABLET | ORAL | Status: DC | PRN

## 2015-06-15 MED ORDER — HYDROMORPHONE HCL 1 MG/ML IJ SOLN
1.0000 mg | Freq: Once | INTRAMUSCULAR | Status: AC
  Administered 2015-06-15: 1 mg via INTRAVENOUS
  Filled 2015-06-15: qty 1
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
## 2015-06-15 NOTE — ED Notes (Signed)
Report attempted 

## 2015-06-16 ENCOUNTER — Inpatient Hospital Stay (HOSPITAL_COMMUNITY): Payer: Commercial Managed Care - HMO

## 2015-06-16 DIAGNOSIS — S2243XA Multiple fractures of ribs, bilateral, initial encounter for closed fracture: Secondary | ICD-10-CM | POA: Diagnosis present

## 2015-06-16 DIAGNOSIS — N189 Chronic kidney disease, unspecified: Secondary | ICD-10-CM | POA: Diagnosis present

## 2015-06-16 LAB — BASIC METABOLIC PANEL
ANION GAP: 6 (ref 5–15)
BUN: 12 mg/dL (ref 6–20)
CALCIUM: 8.2 mg/dL — AB (ref 8.9–10.3)
CO2: 24 mmol/L (ref 22–32)
CREATININE: 1.3 mg/dL — AB (ref 0.61–1.24)
Chloride: 106 mmol/L (ref 101–111)
GFR calc Af Amer: >60 mL/min (ref >60)
GFR calc non Af Amer: >60 mL/min (ref >60)
GLUCOSE: 124 mg/dL — AB (ref 65–99)
Potassium: 4.1 mmol/L (ref 3.5–5.1)
Sodium: 136 mmol/L (ref 135–145)

## 2015-06-16 MED ORDER — OXYCODONE HCL 5 MG PO TABS
10.0000 mg | ORAL_TABLET | ORAL | Status: DC | PRN
  Administered 2015-06-16 (×3): 10 mg via ORAL
  Administered 2015-06-17 – 2015-06-18 (×7): 15 mg via ORAL
  Filled 2015-06-16 (×2): qty 3
  Filled 2015-06-16 (×2): qty 2
  Filled 2015-06-16 (×4): qty 3
  Filled 2015-06-16: qty 2
  Filled 2015-06-16: qty 3

## 2015-06-16 MED ORDER — ENSURE ENLIVE PO LIQD
237.0000 mL | Freq: Every day | ORAL | Status: DC
  Administered 2015-06-16 – 2015-06-17 (×2): 237 mL via ORAL

## 2015-06-16 MED ORDER — POLYETHYLENE GLYCOL 3350 17 G PO PACK
17.0000 g | PACK | Freq: Every day | ORAL | Status: DC
  Administered 2015-06-16 – 2015-06-18 (×3): 17 g via ORAL
  Filled 2015-06-16 (×3): qty 1

## 2015-06-16 MED ORDER — HYDROMORPHONE HCL 1 MG/ML IJ SOLN
1.0000 mg | INTRAMUSCULAR | Status: DC | PRN
  Administered 2015-06-16 – 2015-06-17 (×2): 1 mg via INTRAVENOUS
  Filled 2015-06-16 (×2): qty 1
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
## 2015-06-17 MED ORDER — NAPHAZOLINE-PHENIRAMINE 0.025-0.3 % OP SOLN
1.0000 [drp] | Freq: Four times a day (QID) | OPHTHALMIC | Status: DC | PRN
  Administered 2015-06-17 (×2): 1 [drp] via OPHTHALMIC
  Filled 2015-06-17 (×3): qty 5

## 2015-06-17 MED ORDER — TRAMADOL HCL 50 MG PO TABS
100.0000 mg | ORAL_TABLET | Freq: Four times a day (QID) | ORAL | Status: DC
  Administered 2015-06-17 – 2015-06-18 (×4): 100 mg via ORAL
  Filled 2015-06-17 (×5): qty 2
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
## 2015-06-18 MED ORDER — OXYCODONE HCL 10 MG PO TABS: 10.0000 mg | ORAL_TABLET | Freq: Four times a day (QID) | ORAL | Status: DC | PRN

## 2015-06-18 MED ORDER — TRAMADOL HCL 50 MG PO TABS: 100.0000 mg | ORAL_TABLET | Freq: Four times a day (QID) | ORAL | Status: DC

## 2015-06-18 MED ORDER — POLYETHYLENE GLYCOL 3350 17 G PO PACK: 17.0000 g | PACK | Freq: Every day | ORAL | Status: DC

## 2015-06-18 MED ORDER — ACETAMINOPHEN 325 MG PO TABS: 650.0000 mg | ORAL_TABLET | ORAL | Status: DC | PRN

## 2015-06-18 MED ORDER — DOCUSATE SODIUM 100 MG PO CAPS: 100.0000 mg | ORAL_CAPSULE | Freq: Two times a day (BID) | ORAL | Status: DC
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
## 2015-06-18 NOTE — Progress Notes (Signed)
Pt ready for d/c per MD. Cleared by PT, has cane for ambulation. Discharge teaching and prescriptions given to pt and wife at bedside, all questions answered. Belongings gathered and will be removed by wife.   Shane Pugh  06/18/2015 2:20 PM
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
## 2015-06-18 NOTE — Discharge Instructions (Signed)
Rib fracture -heal on their own in 6-8 weeks. -your pain will get better week by week. -continue to walk, use incentive spirometry and activity as tolerated.  If it hurts, don't do it and ease back into activity.  Pain control -avoid Ibuprofen, motrin, advil because of your kidneys. -take the tramadol every 6 hours and than the oxycodone as needed.  If you have severe pain, take 2 tablets, mild pain 1 tablet. -as your pain improves, reduce and wean off the oxycodone first and than the tramadol.
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
## 2015-07-15 ENCOUNTER — Telehealth (HOSPITAL_COMMUNITY): Payer: Self-pay | Admitting: Orthopedic Surgery

## 2015-07-16 NOTE — Telephone Encounter (Signed)
Identified Dr. Lindie SpruceWyatt as attending MD of record.

## 2015-07-22 ENCOUNTER — Ambulatory Visit: Payer: Commercial Managed Care - HMO | Attending: General Surgery | Admitting: Physical Therapy

## 2015-07-22 DIAGNOSIS — R293 Abnormal posture: Secondary | ICD-10-CM | POA: Diagnosis present

## 2015-07-22 DIAGNOSIS — R6889 Other general symptoms and signs: Secondary | ICD-10-CM | POA: Diagnosis present

## 2015-07-22 DIAGNOSIS — R0781 Pleurodynia: Secondary | ICD-10-CM | POA: Diagnosis not present

## 2015-07-22 DIAGNOSIS — R079 Chest pain, unspecified: Secondary | ICD-10-CM | POA: Diagnosis present

## 2015-07-22 DIAGNOSIS — R208 Other disturbances of skin sensation: Secondary | ICD-10-CM | POA: Diagnosis present

## 2015-07-22 DIAGNOSIS — R0789 Other chest pain: Secondary | ICD-10-CM

## 2015-07-22 DIAGNOSIS — R200 Anesthesia of skin: Secondary | ICD-10-CM

## 2015-07-22 NOTE — Patient Instructions (Signed)
Posture Tips DO: - stand tall and erect - keep chin tucked in - keep head and shoulders in alignment - check posture regularly in mirror or large window - pull head back against headrest in car seat;  Change your position often.  Sit with lumbar support. DON'T: - slouch or slump while watching TV or reading - sit, stand or lie in one position  for too long;  Sitting is especially hard on the spine so if you sit at a desk/use the computer, then stand up often!   Copyright  VHI. All rights reserved.  Posture - Standing   Good posture is important. Avoid slouching and forward head thrust. Maintain curve in low back and align ears over shoul- ders, hips over ankles.  Pull your belly button in toward your back bone. Stand with even weight through feet and rib cage up and chin tuck down.  Not military.  Pretend to have golden string in sternum up to sky.   Copyright  VHI. All rights reserved.  Posture - Sitting   Sit upright, head facing forward. Try using a roll to support lower back. Keep shoulders relaxed, and avoid rounded back. Keep hips level with knees. Avoid crossing legs for long periods. Sit on sit bones not tail bone and use a lumbar support.  Avoid perching on edge of the seat.    Copyright  VHI. All rights reserved.  Pt given Right sidelying Quadratus lumborum stretch for left rib stretch.  Garen LahLawrie Beardsley, PT 07/22/2015 10:13 AM Phone: [phone] Fax: [phone]
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
## 2015-07-24 ENCOUNTER — Ambulatory Visit: Payer: Commercial Managed Care - HMO | Admitting: Physical Therapy

## 2015-07-24 DIAGNOSIS — R200 Anesthesia of skin: Secondary | ICD-10-CM

## 2015-07-24 DIAGNOSIS — R293 Abnormal posture: Secondary | ICD-10-CM

## 2015-07-24 DIAGNOSIS — R6889 Other general symptoms and signs: Secondary | ICD-10-CM

## 2015-07-24 DIAGNOSIS — R0789 Other chest pain: Secondary | ICD-10-CM

## 2015-07-24 DIAGNOSIS — R0781 Pleurodynia: Secondary | ICD-10-CM | POA: Diagnosis not present
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
## 2015-07-31 ENCOUNTER — Ambulatory Visit: Payer: Commercial Managed Care - HMO | Admitting: Physical Therapy

## 2015-07-31 DIAGNOSIS — R293 Abnormal posture: Secondary | ICD-10-CM

## 2015-07-31 DIAGNOSIS — R0789 Other chest pain: Secondary | ICD-10-CM

## 2015-07-31 DIAGNOSIS — R200 Anesthesia of skin: Secondary | ICD-10-CM

## 2015-07-31 DIAGNOSIS — R6889 Other general symptoms and signs: Secondary | ICD-10-CM

## 2015-07-31 DIAGNOSIS — R0781 Pleurodynia: Secondary | ICD-10-CM | POA: Diagnosis not present

## 2015-07-31 NOTE — Patient Instructions (Addendum)
Neurovascular: Ulnar Nerve Glide With Elbow Bias - Supine    Lie with neck supported, right arm out to side, elbow bent to pain-free position, fingers pointing toward ear, palm up. Slowly bend elbow further, as far as possible without pain, bringing hand toward head. Repeat ____ times per set. Do ____ sets per session. Do ____ sessions per week.  Copyright  VHI. All rights reserved.  Trigger Point Dry Needling  . What is Trigger Point Dry Needling (DN)? o DN is a physical therapy technique used to treat muscle pain and dysfunction. Specifically, DN helps deactivate muscle trigger points (muscle knots).  o A thin filiform needle is used to penetrate the skin and stimulate the underlying trigger point. The goal is for a local twitch response (LTR) to occur and for the trigger point to relax. No medication of any kind is injected during the procedure.   . What Does Trigger Point Dry Needling Feel Like?  o The procedure feels different for each individual patient. Some patients report that they do not actually feel the needle enter the skin and overall the process is not painful. Very mild bleeding may occur. However, many patients feel a deep cramping in the muscle in which the needle was inserted. This is the local twitch response.   Marland Kitchen. How Will I feel after the treatment? o Soreness is normal, and the onset of soreness may not occur for a few hours. Typically this soreness does not last longer than two days.  o Bruising is uncommon, however; ice can be used to decrease any possible bruising.  o In rare cases feeling tired or nauseous after the treatment is normal. In addition, your symptoms may get worse before they get better, this period will typically not last longer than 24 hours.   . What Can I do After My Treatment? o Increase your hydration by drinking more water for the next 24 hours. o You may place ice or heat on the areas treated that have become sore, however, do not use heat on  inflamed or bruised areas. Heat often brings more relief post needling. o You can continue your regular activities, but vigorous activity is not recommended initially after the treatment for 24 hours. o DN is best combined with other physical therapy such as strengthening, stretching, and other therapies.   Levator Stretch   Grasp seat or sit on hand on side to be stretched. Turn head toward other side and look down. Use hand on head to gently stretch neck in that position.  Look into armpit.Hold __30__ seconds. Repeat on other side. Repeat _2-3___ times. Do _2-3___ sessions per day.  http://gt2.exer.us/30   Copyright  VHI. All rights reserved.  Side-Bending   One hand on opposite side of head, pull head to side as far as is comfortable. Stop if there is pain. Hold _30___ seconds. Repeat with other hand to other side. Repeat __2-3__ times. Do _2-3___ sessions per day.   Copyright  VHI. All rights reserved.  Scapular Retraction (Standing)   With arms at sides, pinch shoulder blades together. Repeat __10__ times per set. Do __1_ sets per session. Do _2-3___ sessions per day.  http://orth.exer.us/944   Copyright  VHI. All rights reserved.  Chin Protraction / Retraction   Slide head forward keeping chin level. Slide head back, pulling chin in. Hold each position _3__ seconds. Repeat _10__ times. Do _3__ sessions per day.  Copyright  VHI. All rights reserved.    Shane Pugh, PT 07/31/2015 9:59 AM  Phone: [phone] Fax: [phone]
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
## 2015-08-04 ENCOUNTER — Ambulatory Visit: Payer: Commercial Managed Care - HMO | Admitting: Physical Therapy

## 2015-08-04 DIAGNOSIS — R6889 Other general symptoms and signs: Secondary | ICD-10-CM

## 2015-08-04 DIAGNOSIS — R0781 Pleurodynia: Secondary | ICD-10-CM | POA: Diagnosis not present

## 2015-08-04 DIAGNOSIS — R200 Anesthesia of skin: Secondary | ICD-10-CM
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
## 2015-08-07 ENCOUNTER — Ambulatory Visit: Payer: Commercial Managed Care - HMO | Attending: General Surgery | Admitting: Physical Therapy

## 2015-08-07 DIAGNOSIS — R0781 Pleurodynia: Secondary | ICD-10-CM | POA: Insufficient documentation

## 2015-08-07 DIAGNOSIS — R079 Chest pain, unspecified: Secondary | ICD-10-CM | POA: Insufficient documentation

## 2015-08-07 DIAGNOSIS — R293 Abnormal posture: Secondary | ICD-10-CM | POA: Diagnosis present

## 2015-08-07 DIAGNOSIS — R208 Other disturbances of skin sensation: Secondary | ICD-10-CM | POA: Diagnosis not present

## 2015-08-07 DIAGNOSIS — R200 Anesthesia of skin: Secondary | ICD-10-CM

## 2015-08-07 DIAGNOSIS — R6889 Other general symptoms and signs: Secondary | ICD-10-CM | POA: Diagnosis present

## 2015-08-07 DIAGNOSIS — R0789 Other chest pain: Secondary | ICD-10-CM

## 2015-08-07 NOTE — Patient Instructions (Signed)
Knee Roll    Lying on back, with knees bent and feet flat on floor, arms outstretched to sides, slowly roll both knees to side, hold 5 seconds. Back to starting position, hold 30 seconds. Then to opposite side, hold 5 seconds. Return to starting position. Keep shoulders and arms in contact with floor.   Copyright  VHI. All rights reserved.  Garen LahLawrie Jeree Delcid, PT 08/07/2015 9:55 AM Phone: [phone] Fax: [phone]
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
## 2015-08-12 ENCOUNTER — Ambulatory Visit: Payer: Commercial Managed Care - HMO | Admitting: Physical Therapy

## 2015-08-12 DIAGNOSIS — R293 Abnormal posture: Secondary | ICD-10-CM

## 2015-08-12 DIAGNOSIS — R6889 Other general symptoms and signs: Secondary | ICD-10-CM

## 2015-08-12 DIAGNOSIS — R0789 Other chest pain: Secondary | ICD-10-CM

## 2015-08-12 DIAGNOSIS — R208 Other disturbances of skin sensation: Secondary | ICD-10-CM | POA: Diagnosis not present

## 2015-08-12 DIAGNOSIS — R200 Anesthesia of skin: Secondary | ICD-10-CM

## 2015-08-12 DIAGNOSIS — R0781 Pleurodynia: Secondary | ICD-10-CM
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
## 2015-08-12 NOTE — Patient Instructions (Signed)

## 2015-08-14 ENCOUNTER — Ambulatory Visit: Payer: Commercial Managed Care - HMO | Admitting: Physical Therapy

## 2015-08-19 ENCOUNTER — Encounter: Payer: Self-pay | Admitting: Physical Therapy

## 2015-08-21 ENCOUNTER — Encounter: Payer: Self-pay | Admitting: Physical Therapy

## 2016-07-04 ENCOUNTER — Emergency Department (HOSPITAL_COMMUNITY): Payer: Commercial Managed Care - HMO

## 2016-07-04 ENCOUNTER — Encounter (HOSPITAL_COMMUNITY): Payer: Self-pay

## 2016-07-04 ENCOUNTER — Emergency Department (HOSPITAL_COMMUNITY)
Admission: EM | Admit: 2016-07-04 | Discharge: 2016-07-05 | Disposition: A | Discharge status: Home / Self Care | Payer: Commercial Managed Care - HMO | Attending: Emergency Medicine | Admitting: Emergency Medicine

## 2016-07-04 DIAGNOSIS — F17210 Nicotine dependence, cigarettes, uncomplicated: Secondary | ICD-10-CM | POA: Insufficient documentation

## 2016-07-04 DIAGNOSIS — R079 Chest pain, unspecified: Secondary | ICD-10-CM

## 2016-07-04 DIAGNOSIS — N189 Chronic kidney disease, unspecified: Secondary | ICD-10-CM | POA: Diagnosis not present

## 2016-07-04 DIAGNOSIS — R072 Precordial pain: Secondary | ICD-10-CM | POA: Diagnosis present

## 2016-07-04 LAB — CBC WITH DIFFERENTIAL/PLATELET
BASOS ABS: 0.1 10*3/uL (ref 0.0–0.1)
Basophils Relative: 1 %
EOS ABS: 0.5 10*3/uL (ref 0.0–0.7)
Eosinophils Relative: 5 %
HEMATOCRIT: 44.2 % (ref 39.0–52.0)
Hemoglobin: 15.1 g/dL (ref 13.0–17.0)
LYMPHS ABS: 3.5 10*3/uL (ref 0.7–4.0)
Lymphocytes Relative: 37 %
MCH: 28.1 pg (ref 26.0–34.0)
MCHC: 34.2 g/dL (ref 30.0–36.0)
MCV: 82.3 fL (ref 78.0–100.0)
MONOS PCT: 11 %
Monocytes Absolute: 1 10*3/uL (ref 0.1–1.0)
Neutro Abs: 4.3 10*3/uL (ref 1.7–7.7)
Neutrophils Relative %: 46 %
PLATELETS: 215 10*3/uL (ref 150–400)
RBC: 5.37 MIL/uL (ref 4.22–5.81)
RDW: 14.8 % (ref 11.5–15.5)
WBC: 9.4 10*3/uL (ref 4.0–10.5)

## 2016-07-04 LAB — BASIC METABOLIC PANEL
ANION GAP: 8 (ref 5–15)
BUN: 12 mg/dL (ref 6–20)
CALCIUM: 9.1 mg/dL (ref 8.9–10.3)
CO2: 27 mmol/L (ref 22–32)
Chloride: 105 mmol/L (ref 101–111)
Creatinine, Ser: 1.18 mg/dL (ref 0.61–1.24)
Glucose, Bld: 102 mg/dL — ABNORMAL HIGH (ref 65–99)
Potassium: 4.4 mmol/L (ref 3.5–5.1)
SODIUM: 140 mmol/L (ref 135–145)

## 2016-07-04 LAB — I-STAT TROPONIN, ED: Troponin i, poc: 0 ng/mL (ref 0.00–0.08)

## 2016-07-04 LAB — D-DIMER, QUANTITATIVE: D-Dimer, Quant: 0.45 ug/mL-FEU (ref 0.00–0.50)

## 2016-07-04 MED ORDER — NITROGLYCERIN 0.4 MG SL SUBL: 0.4000 mg | SUBLINGUAL_TABLET | SUBLINGUAL | Status: DC | PRN

## 2016-07-04 MED ORDER — SODIUM CHLORIDE 0.9 % IV BOLUS (SEPSIS)
1000.0000 mL | Freq: Once | INTRAVENOUS | Status: AC
  Administered 2016-07-04: 1000 mL via INTRAVENOUS

## 2016-07-04 NOTE — ED Triage Notes (Signed)
Per EMS: Pt began experiencing sharp mid chest pain this afternoon. Pt denies any SOB or nausea/vomiting. Pt took 324 ASA prior to arrival. Pt states intermittent sharp chest pain.
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
## 2016-07-05 DIAGNOSIS — R072 Precordial pain: Secondary | ICD-10-CM | POA: Diagnosis not present

## 2016-07-05 LAB — I-STAT TROPONIN, ED: Troponin i, poc: 0 ng/mL (ref 0.00–0.08)

## 2016-10-28 DIAGNOSIS — Z720 Tobacco use: Secondary | ICD-10-CM | POA: Diagnosis not present

## 2016-10-28 DIAGNOSIS — A084 Viral intestinal infection, unspecified: Secondary | ICD-10-CM | POA: Diagnosis not present

## 2016-10-28 DIAGNOSIS — R0789 Other chest pain: Secondary | ICD-10-CM | POA: Diagnosis not present

## 2017-02-02 DIAGNOSIS — Z0000 Encounter for general adult medical examination without abnormal findings: Secondary | ICD-10-CM | POA: Diagnosis not present

## 2017-02-02 DIAGNOSIS — E7800 Pure hypercholesterolemia, unspecified: Secondary | ICD-10-CM | POA: Diagnosis not present

## 2017-02-02 DIAGNOSIS — R030 Elevated blood-pressure reading, without diagnosis of hypertension: Secondary | ICD-10-CM | POA: Diagnosis not present

## 2017-02-02 DIAGNOSIS — Z125 Encounter for screening for malignant neoplasm of prostate: Secondary | ICD-10-CM | POA: Diagnosis not present

## 2017-02-11 DIAGNOSIS — Z1211 Encounter for screening for malignant neoplasm of colon: Secondary | ICD-10-CM | POA: Diagnosis not present

## 2017-03-21 DIAGNOSIS — Z1211 Encounter for screening for malignant neoplasm of colon: Secondary | ICD-10-CM | POA: Diagnosis not present

## 2017-03-21 DIAGNOSIS — K640 First degree hemorrhoids: Secondary | ICD-10-CM | POA: Diagnosis not present

## 2017-04-23 ENCOUNTER — Encounter (HOSPITAL_COMMUNITY): Payer: Self-pay | Admitting: Emergency Medicine

## 2017-04-23 ENCOUNTER — Emergency Department (HOSPITAL_COMMUNITY)
Admission: EM | Admit: 2017-04-23 | Discharge: 2017-04-23 | Disposition: A | Discharge status: Home / Self Care | Payer: Commercial Managed Care - HMO | Attending: Emergency Medicine | Admitting: Emergency Medicine

## 2017-04-23 ENCOUNTER — Emergency Department (HOSPITAL_COMMUNITY): Payer: Commercial Managed Care - HMO

## 2017-04-23 DIAGNOSIS — M79652 Pain in left thigh: Secondary | ICD-10-CM | POA: Diagnosis not present

## 2017-04-23 DIAGNOSIS — S79922A Unspecified injury of left thigh, initial encounter: Secondary | ICD-10-CM | POA: Diagnosis present

## 2017-04-23 DIAGNOSIS — Y998 Other external cause status: Secondary | ICD-10-CM | POA: Diagnosis not present

## 2017-04-23 DIAGNOSIS — Y92410 Unspecified street and highway as the place of occurrence of the external cause: Secondary | ICD-10-CM | POA: Insufficient documentation

## 2017-04-23 DIAGNOSIS — S50312A Abrasion of left elbow, initial encounter: Secondary | ICD-10-CM | POA: Diagnosis not present

## 2017-04-23 DIAGNOSIS — S7010XA Contusion of unspecified thigh, initial encounter: Secondary | ICD-10-CM

## 2017-04-23 DIAGNOSIS — S7011XA Contusion of right thigh, initial encounter: Secondary | ICD-10-CM | POA: Insufficient documentation

## 2017-04-23 DIAGNOSIS — M25559 Pain in unspecified hip: Secondary | ICD-10-CM | POA: Diagnosis not present

## 2017-04-23 DIAGNOSIS — Z23 Encounter for immunization: Secondary | ICD-10-CM | POA: Diagnosis not present

## 2017-04-23 DIAGNOSIS — Y939 Activity, unspecified: Secondary | ICD-10-CM | POA: Diagnosis not present

## 2017-04-23 DIAGNOSIS — M7989 Other specified soft tissue disorders: Secondary | ICD-10-CM | POA: Diagnosis not present

## 2017-04-23 DIAGNOSIS — F17200 Nicotine dependence, unspecified, uncomplicated: Secondary | ICD-10-CM | POA: Diagnosis not present

## 2017-04-23 DIAGNOSIS — T148XXA Other injury of unspecified body region, initial encounter: Secondary | ICD-10-CM | POA: Diagnosis not present

## 2017-04-23 DIAGNOSIS — S7012XA Contusion of left thigh, initial encounter: Secondary | ICD-10-CM | POA: Diagnosis not present

## 2017-04-23 MED ORDER — TETANUS-DIPHTH-ACELL PERTUSSIS 5-2.5-18.5 LF-MCG/0.5 IM SUSP
0.5000 mL | Freq: Once | INTRAMUSCULAR | Status: AC
  Administered 2017-04-23: 0.5 mL via INTRAMUSCULAR
  Filled 2017-04-23: qty 0.5

## 2017-04-23 MED ORDER — BACITRACIN ZINC 500 UNIT/GM EX OINT
TOPICAL_OINTMENT | Freq: Two times a day (BID) | CUTANEOUS | Status: DC
  Administered 2017-04-23: 1 via TOPICAL
  Filled 2017-04-23: qty 2.7

## 2017-04-23 MED ORDER — BACITRACIN ZINC 500 UNIT/GM EX OINT: 1.0000 "application " | TOPICAL_OINTMENT | Freq: Two times a day (BID) | CUTANEOUS | 0 refills | Status: DC

## 2017-04-23 MED ORDER — KETOROLAC TROMETHAMINE 30 MG/ML IJ SOLN
30.0000 mg | Freq: Once | INTRAMUSCULAR | Status: AC
  Administered 2017-04-23: 30 mg via INTRAVENOUS
  Filled 2017-04-23: qty 1

## 2017-04-23 MED ORDER — HYDROCODONE-ACETAMINOPHEN 5-325 MG PO TABS: 2.0000 | ORAL_TABLET | ORAL | 0 refills | Status: DC | PRN

## 2017-04-23 MED ORDER — ORPHENADRINE CITRATE ER 100 MG PO TB12: 100.0000 mg | ORAL_TABLET | Freq: Two times a day (BID) | ORAL | 0 refills | Status: DC

## 2017-04-23 MED ORDER — IBUPROFEN 800 MG PO TABS: 800.0000 mg | ORAL_TABLET | Freq: Three times a day (TID) | ORAL | 0 refills | Status: DC

## 2017-04-23 NOTE — Discharge Instructions (Signed)
See your family doctor for recheck in 2 days. Return to the emergency department if your pain and swelling are worsening significantly. Follow precautionary instructions for compartment syndrome.

## 2017-04-23 NOTE — ED Notes (Signed)
Patient transported to X-ray 
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
## 2017-04-23 NOTE — ED Notes (Signed)
Bacitrtacin applied to wounds and wrapped.
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
## 2017-04-24 DIAGNOSIS — G4733 Obstructive sleep apnea (adult) (pediatric): Secondary | ICD-10-CM | POA: Diagnosis not present

## 2017-04-25 DIAGNOSIS — G4733 Obstructive sleep apnea (adult) (pediatric): Secondary | ICD-10-CM | POA: Diagnosis not present

## 2017-05-04 DIAGNOSIS — S7012XS Contusion of left thigh, sequela: Secondary | ICD-10-CM | POA: Diagnosis not present

## 2017-05-06 DIAGNOSIS — T148XXA Other injury of unspecified body region, initial encounter: Secondary | ICD-10-CM | POA: Diagnosis not present

## 2017-05-06 DIAGNOSIS — S7012XA Contusion of left thigh, initial encounter: Secondary | ICD-10-CM | POA: Diagnosis not present

## 2017-05-19 DIAGNOSIS — G4733 Obstructive sleep apnea (adult) (pediatric): Secondary | ICD-10-CM | POA: Diagnosis not present

## 2017-08-10 DIAGNOSIS — G4733 Obstructive sleep apnea (adult) (pediatric): Secondary | ICD-10-CM | POA: Diagnosis not present

## 2017-10-11 DIAGNOSIS — J309 Allergic rhinitis, unspecified: Secondary | ICD-10-CM | POA: Diagnosis not present

## 2017-10-11 DIAGNOSIS — G4730 Sleep apnea, unspecified: Secondary | ICD-10-CM | POA: Diagnosis not present

## 2018-02-15 DIAGNOSIS — Z0000 Encounter for general adult medical examination without abnormal findings: Secondary | ICD-10-CM | POA: Diagnosis not present

## 2018-02-15 DIAGNOSIS — E7800 Pure hypercholesterolemia, unspecified: Secondary | ICD-10-CM | POA: Diagnosis not present

## 2018-05-19 DIAGNOSIS — E7800 Pure hypercholesterolemia, unspecified: Secondary | ICD-10-CM | POA: Diagnosis not present

## 2018-06-28 DIAGNOSIS — G4733 Obstructive sleep apnea (adult) (pediatric): Secondary | ICD-10-CM | POA: Diagnosis not present

## 2018-07-24 DIAGNOSIS — E7800 Pure hypercholesterolemia, unspecified: Secondary | ICD-10-CM | POA: Diagnosis not present

## 2019-02-21 DIAGNOSIS — Z0000 Encounter for general adult medical examination without abnormal findings: Secondary | ICD-10-CM | POA: Diagnosis not present

## 2021-10-15 ENCOUNTER — Ambulatory Visit (HOSPITAL_COMMUNITY)
Admission: EM | Admit: 2021-10-15 | Discharge: 2021-10-15 | Disposition: A | Discharge status: Home / Self Care | Payer: 59 | Attending: Internal Medicine | Admitting: Internal Medicine

## 2021-10-15 ENCOUNTER — Encounter (HOSPITAL_COMMUNITY): Payer: Self-pay

## 2021-10-15 ENCOUNTER — Other Ambulatory Visit: Payer: Self-pay

## 2021-10-15 DIAGNOSIS — M7711 Lateral epicondylitis, right elbow: Secondary | ICD-10-CM | POA: Diagnosis not present

## 2021-10-15 MED ORDER — PREDNISONE 20 MG PO TABS: 40.0000 mg | ORAL_TABLET | Freq: Every day | ORAL | 0 refills | Status: DC
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
## 2021-10-15 NOTE — Discharge Instructions (Addendum)
Gentle range of motion exercises Gentle stretching before and after bowling/golf Take medications as prescribed Icing of the right elbow to help with inflammation Elbow sleeve will help with pain. Return to urgent care or sports medicine if you have worsening symptoms.
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
## 2021-10-20 ENCOUNTER — Encounter (HOSPITAL_COMMUNITY): Payer: Self-pay | Admitting: *Deleted

## 2021-10-20 ENCOUNTER — Ambulatory Visit (HOSPITAL_COMMUNITY)
Admission: EM | Admit: 2021-10-20 | Discharge: 2021-10-20 | Disposition: A | Discharge status: Home / Self Care | Payer: 59 | Attending: Family Medicine | Admitting: Family Medicine

## 2021-10-20 ENCOUNTER — Other Ambulatory Visit: Payer: Self-pay

## 2021-10-20 ENCOUNTER — Ambulatory Visit (INDEPENDENT_AMBULATORY_CARE_PROVIDER_SITE_OTHER): Payer: 59

## 2021-10-20 DIAGNOSIS — M25521 Pain in right elbow: Secondary | ICD-10-CM

## 2021-10-20 MED ORDER — NAPROXEN 500 MG PO TABS: 500.0000 mg | ORAL_TABLET | Freq: Two times a day (BID) | ORAL | 0 refills | Status: DC

## 2021-10-20 NOTE — ED Triage Notes (Signed)
Pt reports on going Rt forearm pain . Pt seen and treated last week but pain has not improved.

## 2021-10-20 NOTE — Discharge Instructions (Addendum)
Your x-ray was negative for any bony problem. Take naproxen 500 mg twice daily for pain.
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
## 2021-11-09 ENCOUNTER — Other Ambulatory Visit: Payer: Self-pay

## 2021-11-09 ENCOUNTER — Ambulatory Visit (HOSPITAL_COMMUNITY)
Admission: EM | Admit: 2021-11-09 | Discharge: 2021-11-09 | Disposition: A | Discharge status: Home / Self Care | Payer: 59 | Attending: Physician Assistant | Admitting: Physician Assistant

## 2021-11-09 ENCOUNTER — Encounter (HOSPITAL_COMMUNITY): Payer: Self-pay

## 2021-11-09 DIAGNOSIS — M79631 Pain in right forearm: Secondary | ICD-10-CM

## 2021-11-09 MED ORDER — DICLOFENAC SODIUM 75 MG PO TBEC: 75.0000 mg | DELAYED_RELEASE_TABLET | Freq: Two times a day (BID) | ORAL | 0 refills | Status: AC

## 2021-11-09 NOTE — ED Triage Notes (Signed)
Pt presents with c/o R forearm pain.  States it has been bothering him for weeks. States nothing has helped.

## 2021-11-09 NOTE — Discharge Instructions (Signed)
I believe that you have injured a muscle.  Please use diclofenac twice daily for pain relief.  Do not take additional NSAIDs including aspirin, ibuprofen/Advil, naproxen/Aleve with this medication as it can cause stomach bleeding.  You can use Tylenol as needed for breakthrough pain.  Use compression/elbow sleeve for additional symptom relief.  Avoid strenuous activity and repetitive motions of the elbow.  Given your symptoms have not responded to conservative treatment I recommend you follow-up with sports medicine.  Please call to schedule appointment with them as soon as possible.  If you have any sudden worsening of symptoms please return for reevaluation.
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
## 2022-11-16 IMAGING — DX DG ELBOW COMPLETE 3+V*R*
3 series · 3 of 3 positions shown · non-contrast
Comparison: None.

CLINICAL DATA: Elbow pain

EXAM:
RIGHT ELBOW - COMPLETE 3 VIEW

[elbow ap]
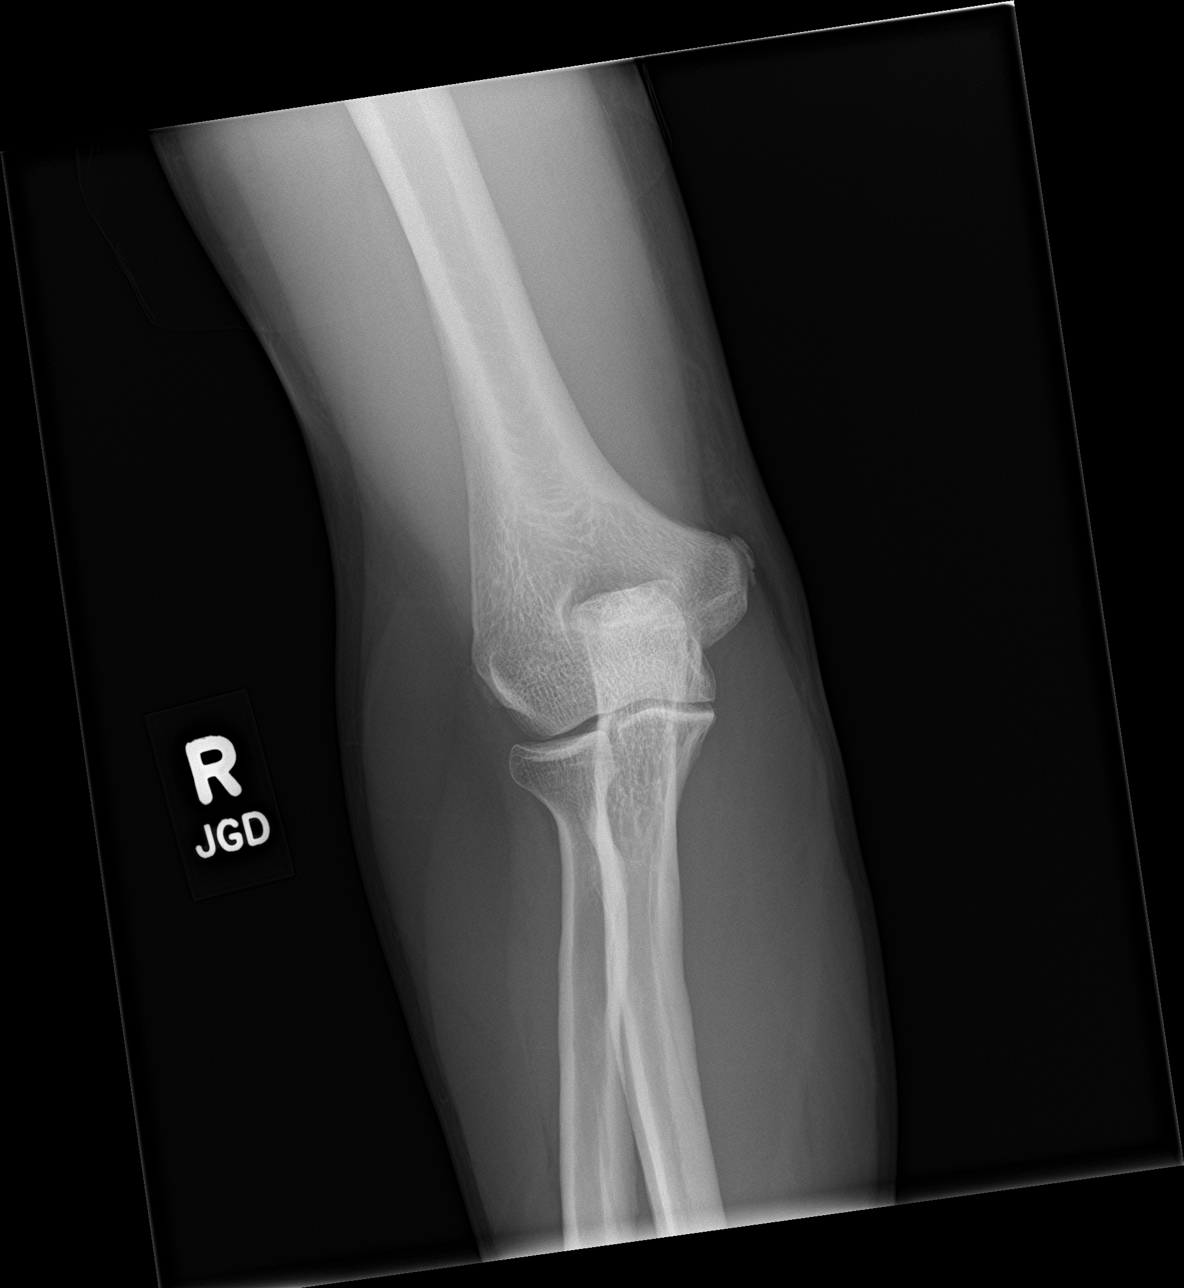

[elbow obl]
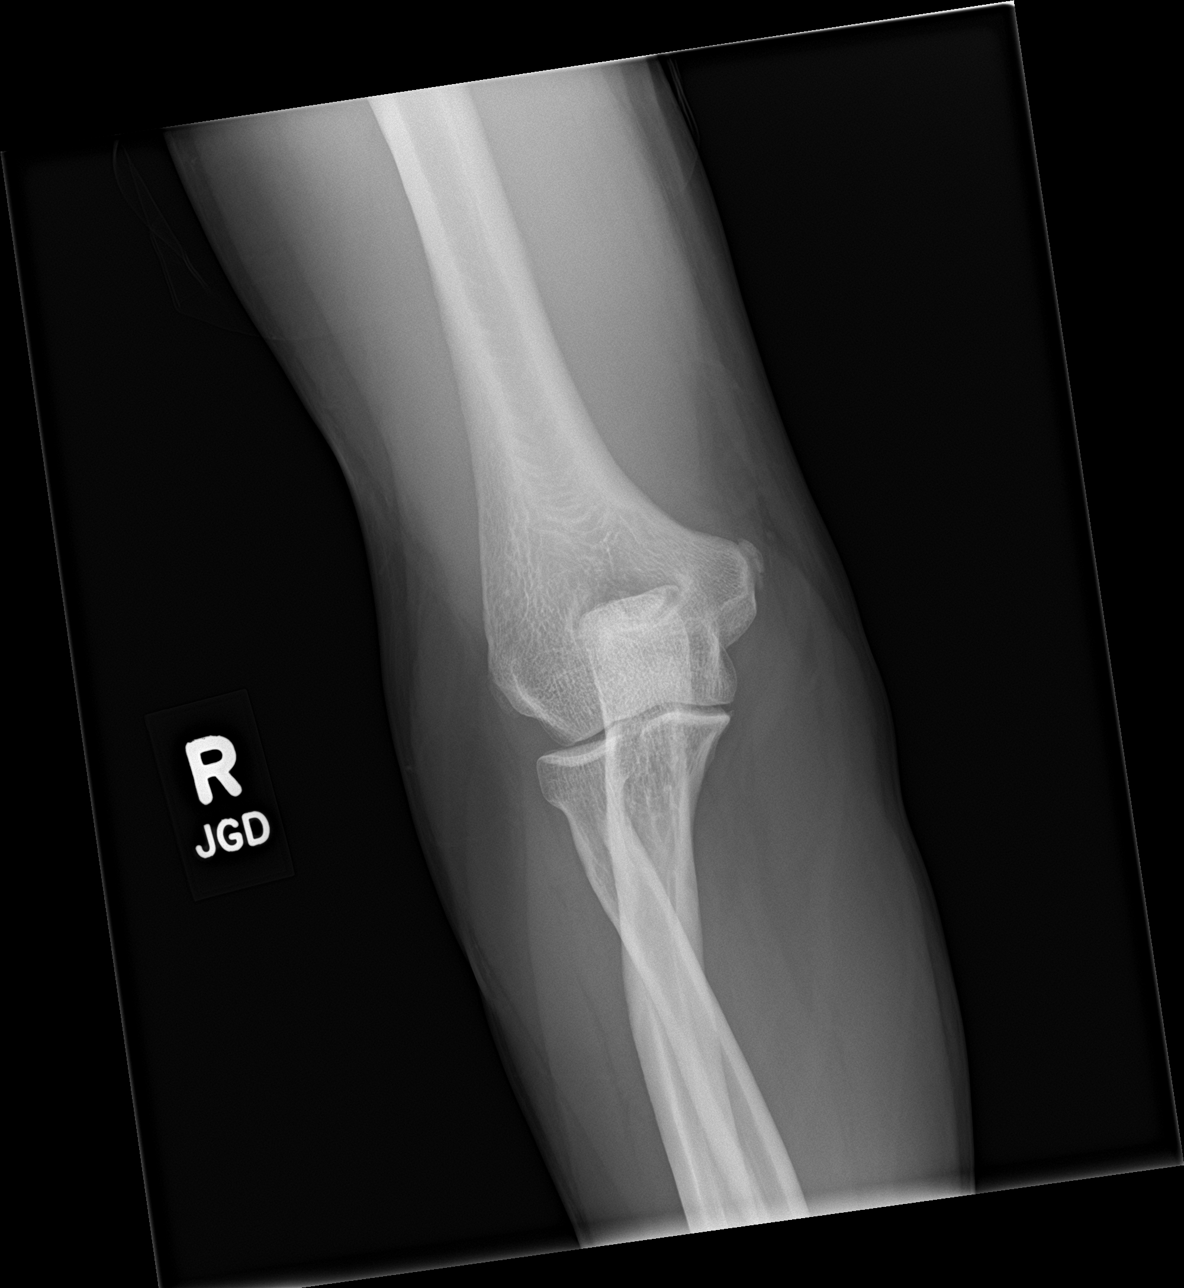

[elbow lat]
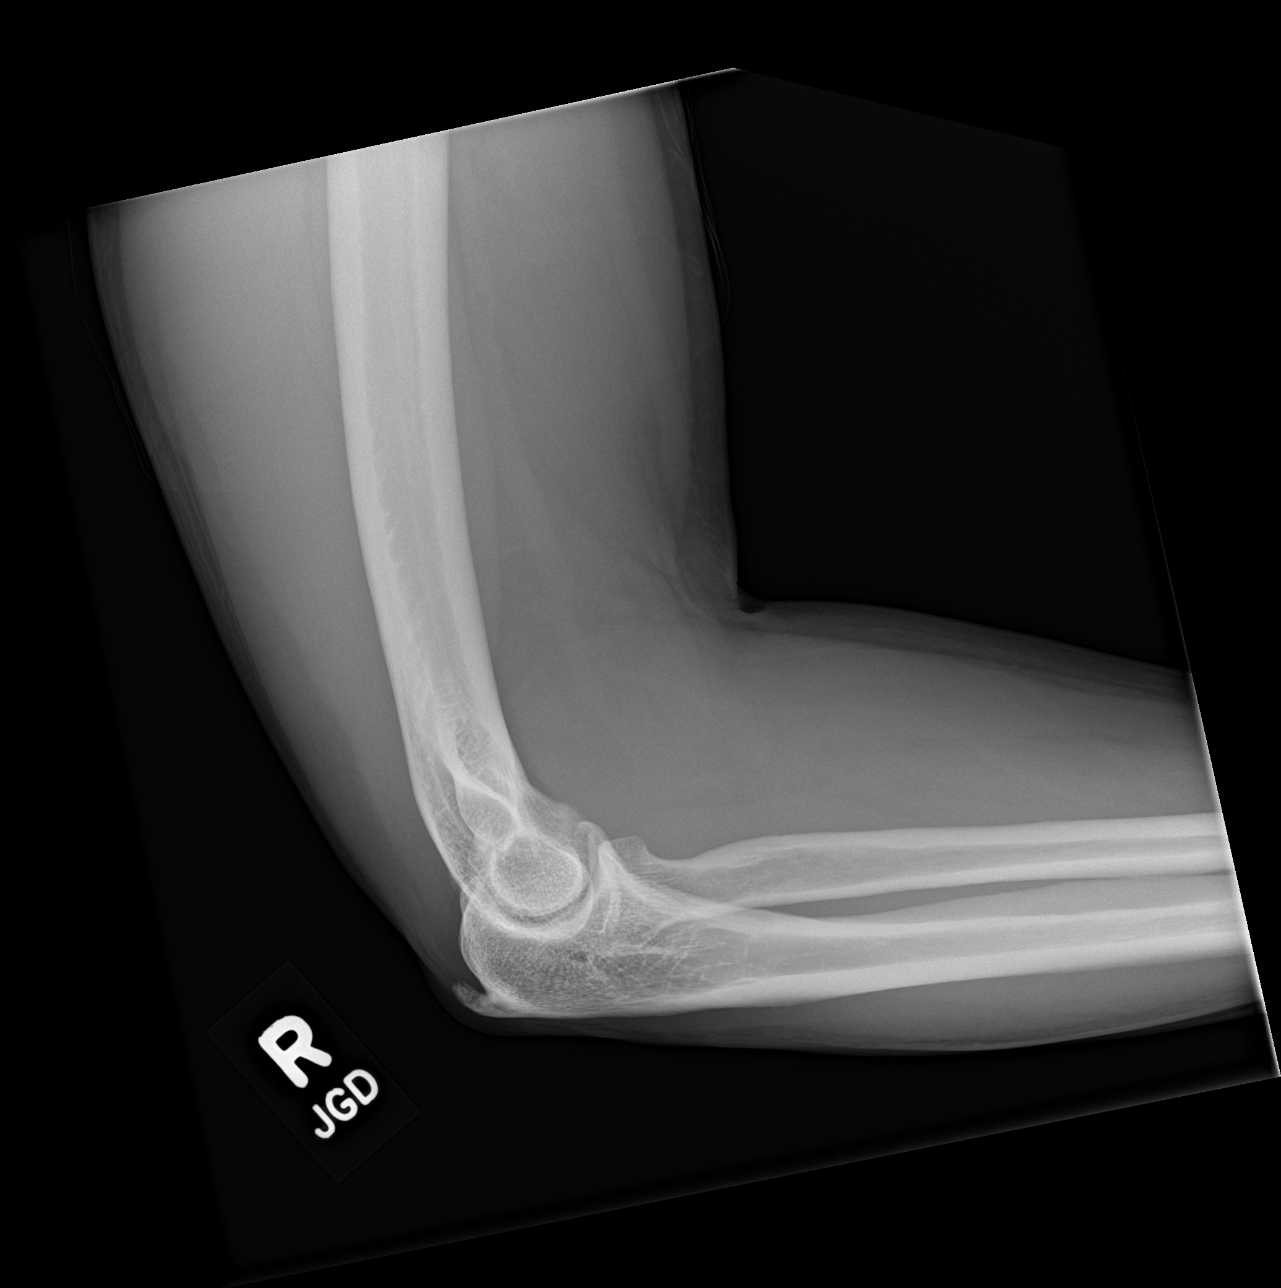

[3 of 3 positions shown; findings below may reference images not displayed]

FINDINGS: There is no evidence of fracture, dislocation, or joint effusion.
Enthesophytes of the olecranon process and medial condyle. There is
no evidence of arthropathy or other focal bone abnormality. Soft
tissues are unremarkable.
IMPRESSION: No acute osseous abnormality.
